# Patient Record
Sex: Female | Born: 1965 | Race: White | Hispanic: No | Marital: Married | Smoking: Never smoker
Health system: Southern US, Community
[De-identification: ages and names within clinical notes are randomized; demographics above are authoritative.]

## PROBLEM LIST (undated history)

## (undated) DIAGNOSIS — K219 Gastro-esophageal reflux disease without esophagitis: Secondary | ICD-10-CM

## (undated) DIAGNOSIS — Z9889 Other specified postprocedural states: Secondary | ICD-10-CM

## (undated) DIAGNOSIS — T7840XA Allergy, unspecified, initial encounter: Secondary | ICD-10-CM

## (undated) DIAGNOSIS — R112 Nausea with vomiting, unspecified: Secondary | ICD-10-CM

## (undated) HISTORY — DX: Other specified postprocedural states: R11.2

## (undated) HISTORY — DX: Other specified postprocedural states: Z98.890

## (undated) HISTORY — PX: BASAL CELL CARCINOMA EXCISION: SHX1214

## (undated) HISTORY — DX: Gastro-esophageal reflux disease without esophagitis: K21.9

## (undated) HISTORY — DX: Allergy, unspecified, initial encounter: T78.40XA

---

## 1997-12-17 ENCOUNTER — Other Ambulatory Visit: Admission: RE | Admit: 1997-12-17 | Discharge: 1997-12-17 | Payer: Self-pay | Admitting: Obstetrics and Gynecology

## 2000-03-08 ENCOUNTER — Other Ambulatory Visit: Admission: RE | Admit: 2000-03-08 | Discharge: 2000-03-08 | Payer: Self-pay | Admitting: Obstetrics and Gynecology

## 2001-05-24 ENCOUNTER — Other Ambulatory Visit: Admission: RE | Admit: 2001-05-24 | Discharge: 2001-05-24 | Payer: Self-pay | Admitting: Internal Medicine

## 2008-07-19 HISTORY — PX: CHOLECYSTECTOMY: SHX55

## 2009-05-09 ENCOUNTER — Encounter: Admission: RE | Admit: 2009-05-09 | Discharge: 2009-05-09 | Payer: Self-pay | Admitting: Family Medicine

## 2009-06-24 ENCOUNTER — Observation Stay (HOSPITAL_COMMUNITY): Admission: RE | Admit: 2009-06-24 | Discharge: 2009-06-25 | Payer: Self-pay | Admitting: Surgery

## 2009-06-24 ENCOUNTER — Encounter (INDEPENDENT_AMBULATORY_CARE_PROVIDER_SITE_OTHER): Payer: Self-pay | Admitting: Surgery

## 2010-04-04 IMAGING — RF DG CHOLANGIOGRAM OPERATIVE
1 series · 4 of 4 positions shown · non-contrast
Comparison: none

::  Gallstones.

Exam:  Diagnostic cholangiogram operative

[Series 1: run · 4 of 40 frames shown]
[frame 7/40]
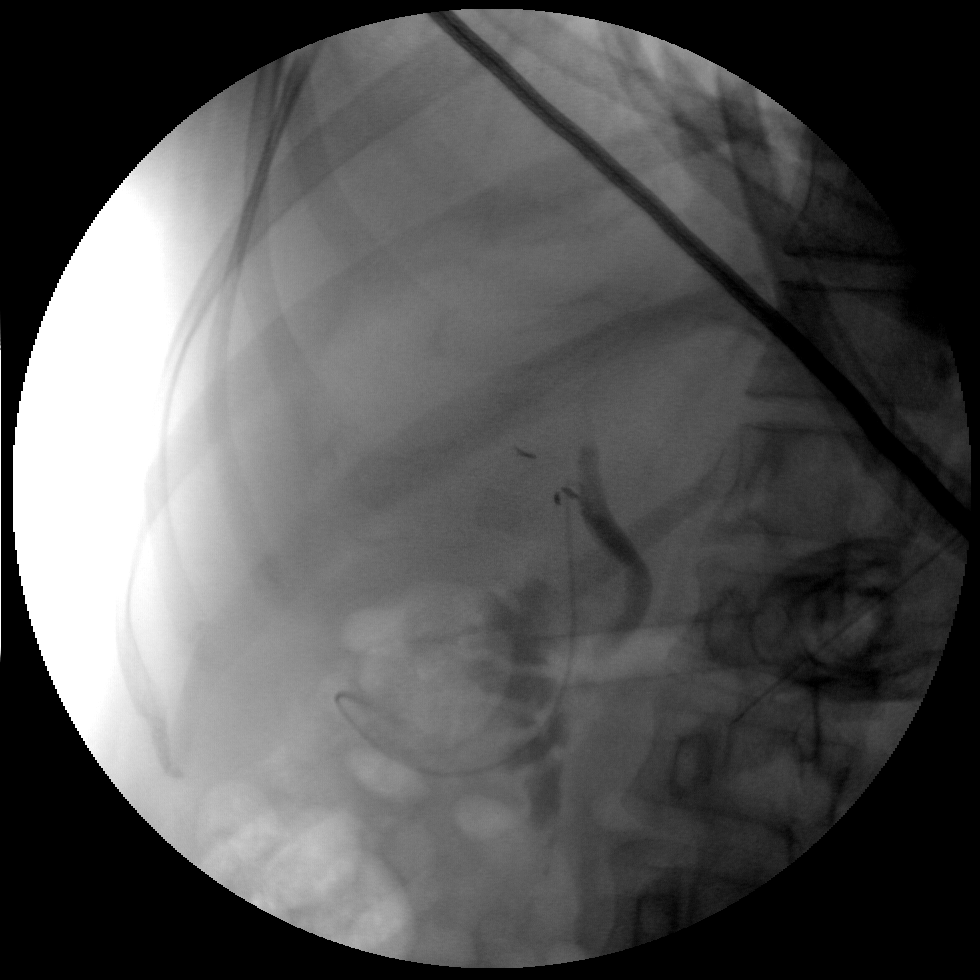
[frame 21/40]
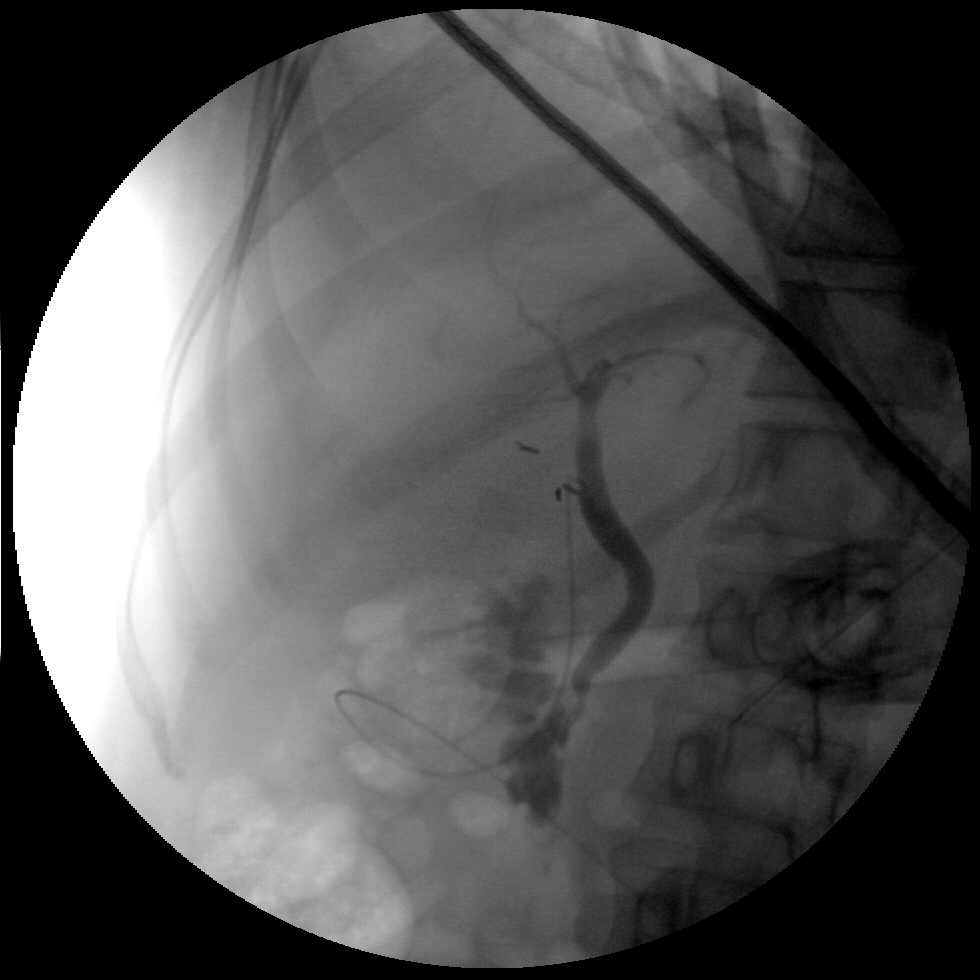
[frame 30/40]
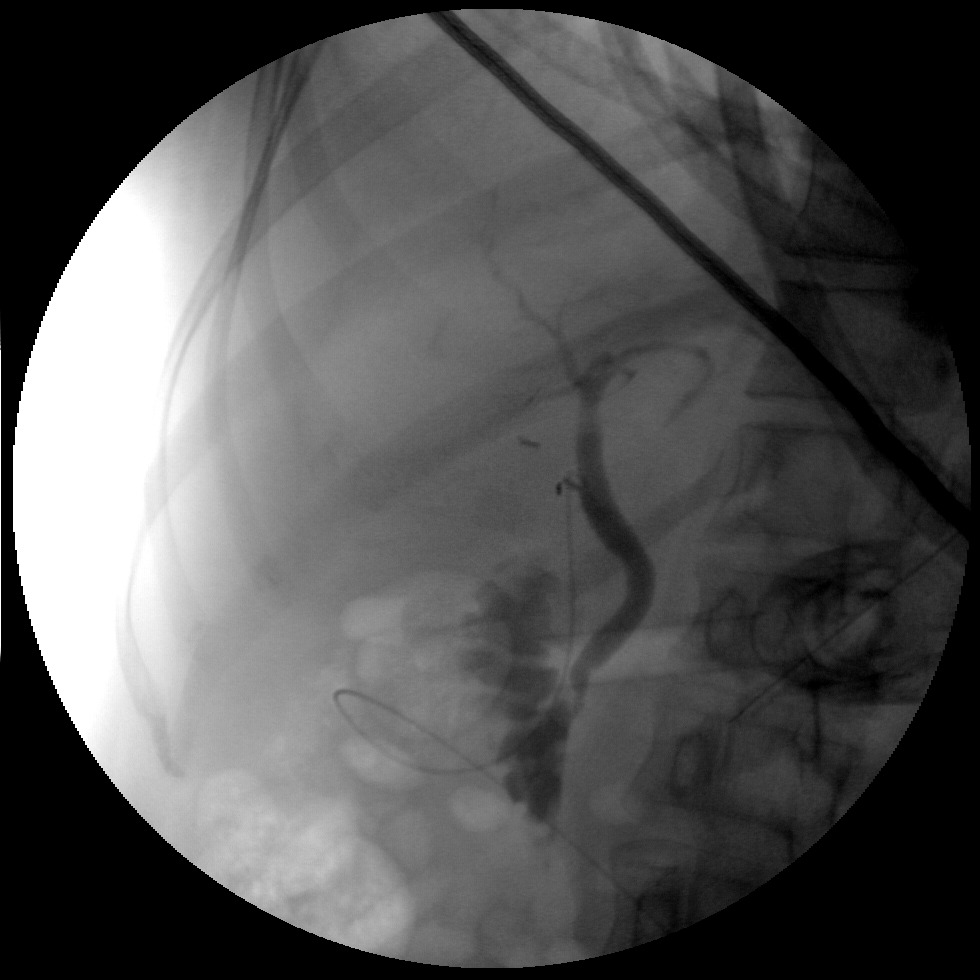
[frame 35/40]
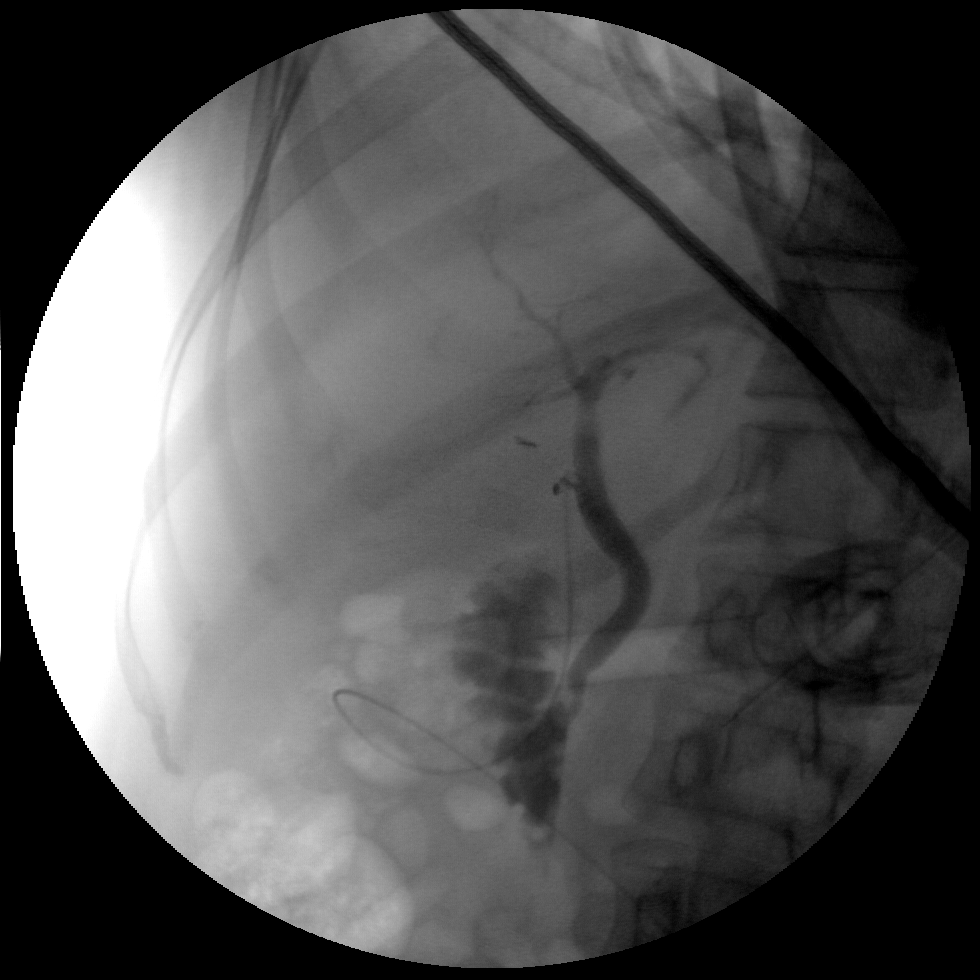

[4 of 4 positions shown; findings below may reference images not displayed]

FINDINGS: Contrast injection of the cystic duct led to the
excellent degree of opacification of the biliary ducts.  There is
no ductal stricture, dilatation, or filling defects to suggest
calculi.
IMPRESSION: Negative operative cholangiogram.

## 2010-07-19 DIAGNOSIS — D259 Leiomyoma of uterus, unspecified: Secondary | ICD-10-CM | POA: Insufficient documentation

## 2010-10-20 LAB — COMPREHENSIVE METABOLIC PANEL
ALT: 14 U/L (ref 0–35)
BUN: 8 mg/dL (ref 6–23)
CO2: 23 mEq/L (ref 19–32)
Calcium: 9.8 mg/dL (ref 8.4–10.5)
GFR calc Af Amer: >60 mL/min (ref >60)
Potassium: 4.4 mEq/L (ref 3.5–5.1)
Sodium: 138 mEq/L (ref 135–145)
Total Protein: 7 g/dL (ref 6.0–8.3)

## 2010-10-20 LAB — URINALYSIS, ROUTINE W REFLEX MICROSCOPIC
Bilirubin Urine: NEGATIVE
Hgb urine dipstick: NEGATIVE
Ketones, ur: NEGATIVE mg/dL

## 2010-10-20 LAB — CBC
HCT: 40.4 % (ref 36.0–46.0)
Platelets: 235 10*3/uL (ref 150–400)
RBC: 4.32 MIL/uL (ref 3.87–5.11)
RDW: 13.7 % (ref 11.5–15.5)
WBC: 5.5 10*3/uL (ref 4.0–10.5)

## 2010-10-20 LAB — DIFFERENTIAL
Basophils Absolute: 0 10*3/uL (ref 0.0–0.1)
Monocytes Relative: 7 % (ref 3–12)

## 2010-10-20 LAB — URINE MICROSCOPIC-ADD ON

## 2021-07-06 DIAGNOSIS — Z1231 Encounter for screening mammogram for malignant neoplasm of breast: Secondary | ICD-10-CM | POA: Diagnosis not present

## 2021-07-08 DIAGNOSIS — J301 Allergic rhinitis due to pollen: Secondary | ICD-10-CM | POA: Diagnosis not present

## 2021-07-08 DIAGNOSIS — J3089 Other allergic rhinitis: Secondary | ICD-10-CM | POA: Diagnosis not present

## 2021-07-08 DIAGNOSIS — J3081 Allergic rhinitis due to animal (cat) (dog) hair and dander: Secondary | ICD-10-CM | POA: Diagnosis not present

## 2021-07-09 DIAGNOSIS — Z124 Encounter for screening for malignant neoplasm of cervix: Secondary | ICD-10-CM | POA: Diagnosis not present

## 2021-07-09 DIAGNOSIS — Z1151 Encounter for screening for human papillomavirus (HPV): Secondary | ICD-10-CM | POA: Diagnosis not present

## 2021-07-09 DIAGNOSIS — Z6826 Body mass index (BMI) 26.0-26.9, adult: Secondary | ICD-10-CM | POA: Diagnosis not present

## 2021-07-09 DIAGNOSIS — Z01419 Encounter for gynecological examination (general) (routine) without abnormal findings: Secondary | ICD-10-CM | POA: Diagnosis not present

## 2021-07-09 DIAGNOSIS — Z130 Encounter for screening for diseases of the blood and blood-forming organs and certain disorders involving the immune mechanism: Secondary | ICD-10-CM | POA: Diagnosis not present

## 2021-08-06 DIAGNOSIS — J301 Allergic rhinitis due to pollen: Secondary | ICD-10-CM | POA: Diagnosis not present

## 2021-08-06 DIAGNOSIS — J3081 Allergic rhinitis due to animal (cat) (dog) hair and dander: Secondary | ICD-10-CM | POA: Diagnosis not present

## 2021-08-06 DIAGNOSIS — J3089 Other allergic rhinitis: Secondary | ICD-10-CM | POA: Diagnosis not present

## 2021-08-13 DIAGNOSIS — Z0000 Encounter for general adult medical examination without abnormal findings: Secondary | ICD-10-CM | POA: Diagnosis not present

## 2021-08-20 DIAGNOSIS — Z13220 Encounter for screening for lipoid disorders: Secondary | ICD-10-CM | POA: Diagnosis not present

## 2021-08-20 DIAGNOSIS — Z79899 Other long term (current) drug therapy: Secondary | ICD-10-CM | POA: Diagnosis not present

## 2021-09-04 DIAGNOSIS — J3089 Other allergic rhinitis: Secondary | ICD-10-CM | POA: Diagnosis not present

## 2021-09-04 DIAGNOSIS — J301 Allergic rhinitis due to pollen: Secondary | ICD-10-CM | POA: Diagnosis not present

## 2021-09-04 DIAGNOSIS — J3081 Allergic rhinitis due to animal (cat) (dog) hair and dander: Secondary | ICD-10-CM | POA: Diagnosis not present

## 2021-10-01 DIAGNOSIS — J3081 Allergic rhinitis due to animal (cat) (dog) hair and dander: Secondary | ICD-10-CM | POA: Diagnosis not present

## 2021-10-01 DIAGNOSIS — J301 Allergic rhinitis due to pollen: Secondary | ICD-10-CM | POA: Diagnosis not present

## 2021-10-01 DIAGNOSIS — J3089 Other allergic rhinitis: Secondary | ICD-10-CM | POA: Diagnosis not present

## 2021-10-30 DIAGNOSIS — J3081 Allergic rhinitis due to animal (cat) (dog) hair and dander: Secondary | ICD-10-CM | POA: Diagnosis not present

## 2021-10-30 DIAGNOSIS — J301 Allergic rhinitis due to pollen: Secondary | ICD-10-CM | POA: Diagnosis not present

## 2021-10-30 DIAGNOSIS — J3089 Other allergic rhinitis: Secondary | ICD-10-CM | POA: Diagnosis not present

## 2021-11-24 DIAGNOSIS — J3089 Other allergic rhinitis: Secondary | ICD-10-CM | POA: Diagnosis not present

## 2021-11-26 DIAGNOSIS — J301 Allergic rhinitis due to pollen: Secondary | ICD-10-CM | POA: Diagnosis not present

## 2021-11-26 DIAGNOSIS — J3081 Allergic rhinitis due to animal (cat) (dog) hair and dander: Secondary | ICD-10-CM | POA: Diagnosis not present

## 2021-11-26 DIAGNOSIS — J3089 Other allergic rhinitis: Secondary | ICD-10-CM | POA: Diagnosis not present

## 2021-12-25 DIAGNOSIS — J3081 Allergic rhinitis due to animal (cat) (dog) hair and dander: Secondary | ICD-10-CM | POA: Diagnosis not present

## 2021-12-25 DIAGNOSIS — J3089 Other allergic rhinitis: Secondary | ICD-10-CM | POA: Diagnosis not present

## 2021-12-25 DIAGNOSIS — J301 Allergic rhinitis due to pollen: Secondary | ICD-10-CM | POA: Diagnosis not present

## 2022-01-20 DIAGNOSIS — J301 Allergic rhinitis due to pollen: Secondary | ICD-10-CM | POA: Diagnosis not present

## 2022-01-20 DIAGNOSIS — J3081 Allergic rhinitis due to animal (cat) (dog) hair and dander: Secondary | ICD-10-CM | POA: Diagnosis not present

## 2022-01-20 DIAGNOSIS — J3089 Other allergic rhinitis: Secondary | ICD-10-CM | POA: Diagnosis not present

## 2022-01-22 DIAGNOSIS — J301 Allergic rhinitis due to pollen: Secondary | ICD-10-CM | POA: Diagnosis not present

## 2022-01-22 DIAGNOSIS — J3081 Allergic rhinitis due to animal (cat) (dog) hair and dander: Secondary | ICD-10-CM | POA: Diagnosis not present

## 2022-01-22 DIAGNOSIS — J3089 Other allergic rhinitis: Secondary | ICD-10-CM | POA: Diagnosis not present

## 2022-01-25 DIAGNOSIS — D225 Melanocytic nevi of trunk: Secondary | ICD-10-CM | POA: Diagnosis not present

## 2022-01-25 DIAGNOSIS — D2261 Melanocytic nevi of right upper limb, including shoulder: Secondary | ICD-10-CM | POA: Diagnosis not present

## 2022-01-25 DIAGNOSIS — L814 Other melanin hyperpigmentation: Secondary | ICD-10-CM | POA: Diagnosis not present

## 2022-01-25 DIAGNOSIS — L821 Other seborrheic keratosis: Secondary | ICD-10-CM | POA: Diagnosis not present

## 2022-01-26 DIAGNOSIS — J3081 Allergic rhinitis due to animal (cat) (dog) hair and dander: Secondary | ICD-10-CM | POA: Diagnosis not present

## 2022-01-26 DIAGNOSIS — J3089 Other allergic rhinitis: Secondary | ICD-10-CM | POA: Diagnosis not present

## 2022-01-26 DIAGNOSIS — J301 Allergic rhinitis due to pollen: Secondary | ICD-10-CM | POA: Diagnosis not present

## 2022-01-29 DIAGNOSIS — J301 Allergic rhinitis due to pollen: Secondary | ICD-10-CM | POA: Diagnosis not present

## 2022-01-29 DIAGNOSIS — J3081 Allergic rhinitis due to animal (cat) (dog) hair and dander: Secondary | ICD-10-CM | POA: Diagnosis not present

## 2022-01-29 DIAGNOSIS — J3089 Other allergic rhinitis: Secondary | ICD-10-CM | POA: Diagnosis not present

## 2022-02-03 DIAGNOSIS — J3081 Allergic rhinitis due to animal (cat) (dog) hair and dander: Secondary | ICD-10-CM | POA: Diagnosis not present

## 2022-02-03 DIAGNOSIS — J301 Allergic rhinitis due to pollen: Secondary | ICD-10-CM | POA: Diagnosis not present

## 2022-02-05 DIAGNOSIS — J301 Allergic rhinitis due to pollen: Secondary | ICD-10-CM | POA: Diagnosis not present

## 2022-02-05 DIAGNOSIS — J3081 Allergic rhinitis due to animal (cat) (dog) hair and dander: Secondary | ICD-10-CM | POA: Diagnosis not present

## 2022-02-05 DIAGNOSIS — J3089 Other allergic rhinitis: Secondary | ICD-10-CM | POA: Diagnosis not present

## 2022-03-01 DIAGNOSIS — J3089 Other allergic rhinitis: Secondary | ICD-10-CM | POA: Diagnosis not present

## 2022-03-01 DIAGNOSIS — H1045 Other chronic allergic conjunctivitis: Secondary | ICD-10-CM | POA: Diagnosis not present

## 2022-03-01 DIAGNOSIS — J3081 Allergic rhinitis due to animal (cat) (dog) hair and dander: Secondary | ICD-10-CM | POA: Diagnosis not present

## 2022-03-01 DIAGNOSIS — J301 Allergic rhinitis due to pollen: Secondary | ICD-10-CM | POA: Diagnosis not present

## 2022-03-26 DIAGNOSIS — J3089 Other allergic rhinitis: Secondary | ICD-10-CM | POA: Diagnosis not present

## 2022-03-26 DIAGNOSIS — J301 Allergic rhinitis due to pollen: Secondary | ICD-10-CM | POA: Diagnosis not present

## 2022-03-26 DIAGNOSIS — J3081 Allergic rhinitis due to animal (cat) (dog) hair and dander: Secondary | ICD-10-CM | POA: Diagnosis not present

## 2022-04-23 DIAGNOSIS — J3089 Other allergic rhinitis: Secondary | ICD-10-CM | POA: Diagnosis not present

## 2022-04-23 DIAGNOSIS — J301 Allergic rhinitis due to pollen: Secondary | ICD-10-CM | POA: Diagnosis not present

## 2022-04-23 DIAGNOSIS — J3081 Allergic rhinitis due to animal (cat) (dog) hair and dander: Secondary | ICD-10-CM | POA: Diagnosis not present

## 2022-04-30 DIAGNOSIS — J3089 Other allergic rhinitis: Secondary | ICD-10-CM | POA: Diagnosis not present

## 2022-04-30 DIAGNOSIS — J3081 Allergic rhinitis due to animal (cat) (dog) hair and dander: Secondary | ICD-10-CM | POA: Diagnosis not present

## 2022-04-30 DIAGNOSIS — J301 Allergic rhinitis due to pollen: Secondary | ICD-10-CM | POA: Diagnosis not present

## 2022-05-07 DIAGNOSIS — J3089 Other allergic rhinitis: Secondary | ICD-10-CM | POA: Diagnosis not present

## 2022-05-07 DIAGNOSIS — J3081 Allergic rhinitis due to animal (cat) (dog) hair and dander: Secondary | ICD-10-CM | POA: Diagnosis not present

## 2022-05-07 DIAGNOSIS — J301 Allergic rhinitis due to pollen: Secondary | ICD-10-CM | POA: Diagnosis not present

## 2022-05-14 DIAGNOSIS — J3089 Other allergic rhinitis: Secondary | ICD-10-CM | POA: Diagnosis not present

## 2022-05-14 DIAGNOSIS — J3081 Allergic rhinitis due to animal (cat) (dog) hair and dander: Secondary | ICD-10-CM | POA: Diagnosis not present

## 2022-05-14 DIAGNOSIS — J301 Allergic rhinitis due to pollen: Secondary | ICD-10-CM | POA: Diagnosis not present

## 2022-05-21 DIAGNOSIS — J301 Allergic rhinitis due to pollen: Secondary | ICD-10-CM | POA: Diagnosis not present

## 2022-05-21 DIAGNOSIS — J3089 Other allergic rhinitis: Secondary | ICD-10-CM | POA: Diagnosis not present

## 2022-05-21 DIAGNOSIS — J3081 Allergic rhinitis due to animal (cat) (dog) hair and dander: Secondary | ICD-10-CM | POA: Diagnosis not present

## 2022-06-18 DIAGNOSIS — J3081 Allergic rhinitis due to animal (cat) (dog) hair and dander: Secondary | ICD-10-CM | POA: Diagnosis not present

## 2022-06-18 DIAGNOSIS — J301 Allergic rhinitis due to pollen: Secondary | ICD-10-CM | POA: Diagnosis not present

## 2022-06-18 DIAGNOSIS — J3089 Other allergic rhinitis: Secondary | ICD-10-CM | POA: Diagnosis not present

## 2022-07-13 DIAGNOSIS — Z1231 Encounter for screening mammogram for malignant neoplasm of breast: Secondary | ICD-10-CM | POA: Diagnosis not present

## 2022-07-16 DIAGNOSIS — J3089 Other allergic rhinitis: Secondary | ICD-10-CM | POA: Diagnosis not present

## 2022-07-16 DIAGNOSIS — J301 Allergic rhinitis due to pollen: Secondary | ICD-10-CM | POA: Diagnosis not present

## 2022-07-16 DIAGNOSIS — J3081 Allergic rhinitis due to animal (cat) (dog) hair and dander: Secondary | ICD-10-CM | POA: Diagnosis not present

## 2022-08-16 DIAGNOSIS — J3089 Other allergic rhinitis: Secondary | ICD-10-CM | POA: Diagnosis not present

## 2022-08-16 DIAGNOSIS — J301 Allergic rhinitis due to pollen: Secondary | ICD-10-CM | POA: Diagnosis not present

## 2022-08-16 DIAGNOSIS — Z13220 Encounter for screening for lipoid disorders: Secondary | ICD-10-CM | POA: Diagnosis not present

## 2022-08-16 DIAGNOSIS — J3081 Allergic rhinitis due to animal (cat) (dog) hair and dander: Secondary | ICD-10-CM | POA: Diagnosis not present

## 2022-08-16 DIAGNOSIS — Z79899 Other long term (current) drug therapy: Secondary | ICD-10-CM | POA: Diagnosis not present

## 2022-08-18 DIAGNOSIS — Z23 Encounter for immunization: Secondary | ICD-10-CM | POA: Diagnosis not present

## 2022-08-18 DIAGNOSIS — Z0000 Encounter for general adult medical examination without abnormal findings: Secondary | ICD-10-CM | POA: Diagnosis not present

## 2022-09-02 DIAGNOSIS — R748 Abnormal levels of other serum enzymes: Secondary | ICD-10-CM | POA: Diagnosis not present

## 2022-09-02 DIAGNOSIS — D649 Anemia, unspecified: Secondary | ICD-10-CM | POA: Diagnosis not present

## 2022-09-07 ENCOUNTER — Encounter: Payer: Self-pay | Admitting: Internal Medicine

## 2022-09-13 DIAGNOSIS — J3081 Allergic rhinitis due to animal (cat) (dog) hair and dander: Secondary | ICD-10-CM | POA: Diagnosis not present

## 2022-09-13 DIAGNOSIS — J301 Allergic rhinitis due to pollen: Secondary | ICD-10-CM | POA: Diagnosis not present

## 2022-09-13 DIAGNOSIS — J3089 Other allergic rhinitis: Secondary | ICD-10-CM | POA: Diagnosis not present

## 2022-10-11 DIAGNOSIS — J301 Allergic rhinitis due to pollen: Secondary | ICD-10-CM | POA: Diagnosis not present

## 2022-10-11 DIAGNOSIS — J3081 Allergic rhinitis due to animal (cat) (dog) hair and dander: Secondary | ICD-10-CM | POA: Diagnosis not present

## 2022-10-11 DIAGNOSIS — J3089 Other allergic rhinitis: Secondary | ICD-10-CM | POA: Diagnosis not present

## 2022-11-08 DIAGNOSIS — J301 Allergic rhinitis due to pollen: Secondary | ICD-10-CM | POA: Diagnosis not present

## 2022-11-08 DIAGNOSIS — J3089 Other allergic rhinitis: Secondary | ICD-10-CM | POA: Diagnosis not present

## 2022-11-08 DIAGNOSIS — J3081 Allergic rhinitis due to animal (cat) (dog) hair and dander: Secondary | ICD-10-CM | POA: Diagnosis not present

## 2022-11-12 ENCOUNTER — Ambulatory Visit (AMBULATORY_SURGERY_CENTER): Payer: BC Managed Care – PPO

## 2022-11-12 VITALS — Ht 64.0 in | Wt 157.4 lb

## 2022-11-12 DIAGNOSIS — D251 Intramural leiomyoma of uterus: Secondary | ICD-10-CM | POA: Insufficient documentation

## 2022-11-12 DIAGNOSIS — Z1211 Encounter for screening for malignant neoplasm of colon: Secondary | ICD-10-CM

## 2022-11-12 MED ORDER — NA SULFATE-K SULFATE-MG SULF 17.5-3.13-1.6 GM/177ML PO SOLN: 1.0000 | Freq: Once | ORAL | 0 refills | Status: AC

## 2022-11-12 NOTE — Progress Notes (Signed)
No egg or soy allergy known to patient  Post operative nausea and vomiting  Patient denies ever being told they had issues or difficulty with intubation  No FH of Malignant Hyperthermia Pt is not on diet pills Pt is not on  home 02  Pt is not on blood thinners  Pt denies issues with constipation  No A fib or A flutter Have any cardiac testing pending--no  Pt instructed to use Single[employer] or GoodRx for a price reduction on prep   Patient's chart reviewed by Cathlyn Parsons CNRA prior to previsit and patient appropriate for the [facility].  Previsit completed and red dot placed by patient's name on their procedure day (on provider's schedule).

## 2022-11-29 ENCOUNTER — Encounter: Payer: Self-pay | Admitting: Internal Medicine

## 2022-12-03 DIAGNOSIS — J3081 Allergic rhinitis due to animal (cat) (dog) hair and dander: Secondary | ICD-10-CM | POA: Diagnosis not present

## 2022-12-03 DIAGNOSIS — J3089 Other allergic rhinitis: Secondary | ICD-10-CM | POA: Diagnosis not present

## 2022-12-03 DIAGNOSIS — J301 Allergic rhinitis due to pollen: Secondary | ICD-10-CM | POA: Diagnosis not present

## 2022-12-07 DIAGNOSIS — J301 Allergic rhinitis due to pollen: Secondary | ICD-10-CM | POA: Diagnosis not present

## 2022-12-07 DIAGNOSIS — J3081 Allergic rhinitis due to animal (cat) (dog) hair and dander: Secondary | ICD-10-CM | POA: Diagnosis not present

## 2022-12-08 DIAGNOSIS — J3089 Other allergic rhinitis: Secondary | ICD-10-CM | POA: Diagnosis not present

## 2022-12-10 ENCOUNTER — Encounter: Payer: Self-pay | Admitting: Internal Medicine

## 2022-12-10 ENCOUNTER — Ambulatory Visit: Payer: BC Managed Care – PPO | Admitting: Internal Medicine

## 2022-12-10 VITALS — BP 107/57 | HR 58 | Temp 98.6°F | Resp 9 | Ht 64.0 in | Wt 157.0 lb

## 2022-12-10 DIAGNOSIS — Z1211 Encounter for screening for malignant neoplasm of colon: Secondary | ICD-10-CM | POA: Diagnosis not present

## 2022-12-10 MED ORDER — SODIUM CHLORIDE 0.9 % IV SOLN: 500.0000 mL | Freq: Once | INTRAVENOUS | Status: DC

## 2022-12-10 NOTE — Op Note (Signed)
[facility] Patient Name: Jenny Davenport Procedure Date: 12/10/2022 11:27 AM MRN: [id number] Endoscopist: Beverley Fiedler , MD, [id number] Age: 57 Referring MD:  Date of Birth: [birth year] Gender: Female Account #: [id number] Procedure:                Colonoscopy Indications:              Screening for colorectal malignant neoplasm, This                            is the patient's first colonoscopy Medicines:                Monitored Anesthesia Care Procedure:                Pre-Anesthesia Assessment:                           - Prior to the procedure, a History and Physical                            was performed, and patient medications and                            allergies were reviewed. The patient's tolerance of                            previous anesthesia was also reviewed. The risks                            and benefits of the procedure and the sedation                            options and risks were discussed with the patient.                            All questions were answered, and informed consent                            was obtained. Prior Anticoagulants: The patient has                            taken no anticoagulant or antiplatelet agents. ASA                            Grade Assessment: II - A patient with mild systemic                            disease. After reviewing the risks and benefits,                            the patient was deemed in satisfactory condition to                            undergo the procedure.  After obtaining informed consent, the colonoscope                            was passed under direct vision. Throughout the                            procedure, the patient's blood pressure, pulse, and                            oxygen saturations were monitored continuously. The                            Olympus PCF-H190DL [id number]) Colonoscope was                            introduced through the anus  and advanced to the                            cecum, identified by its appearance. The                            colonoscopy was performed without difficulty. The                            patient tolerated the procedure well. The quality                            of the bowel preparation was excellent. The                            ileocecal valve, appendiceal orifice, and rectum                            were photographed. Scope In: 11:31:26 AM Scope Out: 11:42:48 AM Scope Withdrawal Time: 0 hours 7 minutes 59 seconds  Total Procedure Duration: 0 hours 11 minutes 22 seconds  Findings:                 The digital rectal exam was normal.                           The entire examined colon appeared normal on direct                            and retroflexion views. Complications:            No immediate complications. Estimated Blood Loss:     Estimated blood loss: none. Impression:               - The entire examined colon is normal on direct and                            retroflexion views.                           - No specimens collected. Recommendation:           -  Patient has a contact number available for                            emergencies. The signs and symptoms of potential                            delayed complications were discussed with the                            patient. Return to normal activities tomorrow.                            Written discharge instructions were provided to the                            patient.                           - Resume previous diet.                           - Continue present medications.                           - Repeat colonoscopy in 10 years for screening                            purposes. Beverley Fiedler, MD 12/10/2022 11:44:07 AM This report has been signed electronically.

## 2022-12-10 NOTE — Progress Notes (Signed)
Vitals-DT  Pt's states no medical or surgical changes since previsit or office visit.  

## 2022-12-10 NOTE — Progress Notes (Signed)
GASTROENTEROLOGY PROCEDURE H&P NOTE   Primary Care Physician: Shon Hale, MD    Reason for Procedure:  Colon cancer screening  Plan:    Colonoscopy  Patient is appropriate for endoscopic procedure(s) in the ambulatory ([facility]) setting.  The nature of the procedure, as well as the risks, benefits, and alternatives were carefully and thoroughly reviewed with the patient. Ample time for discussion and questions allowed. The patient understood, was satisfied, and agreed to proceed.     HPI: Jenny Davenport is a 57 y.o. female who presents for screening colonoscopy.  Medical history as below.  Tolerated the prep.  No recent chest pain or shortness of breath.  No abdominal pain today.  Past Medical History:  Diagnosis Date   Allergy    GERD (gastroesophageal reflux disease)    PONV (postoperative nausea and vomiting)     Past Surgical History:  Procedure Laterality Date   BASAL CELL CARCINOMA EXCISION     under nose   CHOLECYSTECTOMY  2010    Prior to Admission medications   Medication Sig Start Date End Date Taking? Authorizing Provider  famotidine (PEPCID) 20 MG tablet Take 20 mg by mouth every evening.   Yes [provider]  Levocetirizine Dihydrochloride (XYZAL PO) Take 1 tablet by mouth every evening.   Yes [provider]  EPINEPHrine 0.3 mg/0.3 mL IJ SOAJ injection Inject 0.3 mg into the muscle as needed.    [provider]    Current Outpatient Medications  Medication Sig Dispense Refill   famotidine (PEPCID) 20 MG tablet Take 20 mg by mouth every evening.     Levocetirizine Dihydrochloride (XYZAL PO) Take 1 tablet by mouth every evening.     EPINEPHrine 0.3 mg/0.3 mL IJ SOAJ injection Inject 0.3 mg into the muscle as needed.     Current Facility-Administered Medications  Medication Dose Route Frequency Provider Last Rate Last Admin   0.9 %  sodium chloride infusion  500 mL Intravenous Once Ayan Yankey, Carie Caddy, MD        Allergies as  of 12/10/2022   (No Known Allergies)    Family History  Problem Relation Age of Onset   Colon polyps Mother    Crohn's disease Father    Esophageal cancer Neg Hx    Rectal cancer Neg Hx    Stomach cancer Neg Hx     Social History   Socioeconomic History   Marital status: Married    Spouse name: Not on file   Number of children: Not on file   Years of education: Not on file   Highest education level: Not on file  Occupational History   Not on file  Tobacco Use   Smoking status: Never   Smokeless tobacco: Never  Vaping Use   Vaping Use: Never used  Substance and Sexual Activity   Alcohol use: Yes    Comment: occ   Drug use: Never   Sexual activity: Not on file  Other Topics Concern   Not on file  Social History Narrative   Not on file   Social Determinants of Health   Financial Resource Strain: Not on file  Food Insecurity: Not on file  Transportation Needs: Not on file  Physical Activity: Not on file  Stress: Not on file  Social Connections: Not on file  Intimate Partner Violence: Not on file    Physical Exam: Vital signs in last 24 hours: @BP  (!) 119/51 (BP Location: Right Arm, Patient Position: Sitting, Cuff Size: Normal)  Pulse 65   Temp 98.6 F (37 C) (Temporal)   Ht 5\' 4"  (1.626 m)   Wt 157 lb (71.2 kg)   SpO2 100%   BMI 26.95 kg/m  GEN: NAD EYE: Sclerae anicteric ENT: MMM CV: Non-tachycardic Pulm: CTA b/l GI: Soft, NT/ND NEURO:  Alert & Oriented x 3   Erick Blinks, MD [facility]  12/10/2022 11:20 AM

## 2022-12-10 NOTE — Patient Instructions (Signed)
Thank you for coming in to see us today. Resume previous diet and medications today Return to regular daily activities tomorrow. Recommend next screening colonoscopy in 10 years.    YOU HAD AN ENDOSCOPIC PROCEDURE TODAY AT THE [facility]:   Refer to the procedure report that was given to you for any specific questions about what was found during the examination.  If the procedure report does not answer your questions, please call your gastroenterologist to clarify.  If you requested that your care partner not be given the details of your procedure findings, then the procedure report has been included in a sealed envelope for you to review at your convenience later.  YOU SHOULD EXPECT: Some feelings of bloating in the abdomen. Passage of more gas than usual.  Walking can help get rid of the air that was put into your GI tract during the procedure and reduce the bloating. If you had a lower endoscopy (such as a colonoscopy or flexible sigmoidoscopy) you may notice spotting of blood in your stool or on the toilet paper. If you underwent a bowel prep for your procedure, you may not have a normal bowel movement for a few days.  Please Note:  You might notice some irritation and congestion in your nose or some drainage.  This is from the oxygen used during your procedure.  There is no need for concern and it should clear up in a day or so.  SYMPTOMS TO REPORT IMMEDIATELY:  Following lower endoscopy (colonoscopy or flexible sigmoidoscopy):  Excessive amounts of blood in the stool  Significant tenderness or worsening of abdominal pains  Swelling of the abdomen that is new, acute  Fever of 100F or higher    For urgent or emergent issues, a gastroenterologist can be reached at any hour by calling [phone]. Do not use MyChart messaging for urgent concerns.    DIET:  We do recommend a small meal at first, but then you may proceed to your regular diet.  Drink plenty of fluids but  you should avoid alcoholic beverages for 24 hours.  ACTIVITY:  You should plan to take it easy for the rest of today and you should NOT DRIVE or use heavy machinery until tomorrow (because of the sedation medicines used during the test).    FOLLOW UP: Our staff will call the number listed on your records the next business day following your procedure.  We will call around 7:15- 8:00 am to check on you and address any questions or concerns that you may have regarding the information given to you following your procedure. If we do not reach you, we will leave a message.     If any biopsies were taken you will be contacted by phone or by letter within the next 1-3 weeks.  Please call us at [phone] if you have not heard about the biopsies in 3 weeks.    SIGNATURES/CONFIDENTIALITY: You and/or your care partner have signed paperwork which will be entered into your electronic medical record.  These signatures attest to the fact that that the information above on your After Visit Summary has been reviewed and is understood.  Full responsibility of the confidentiality of this discharge information lies with you and/or your care-partner. 

## 2022-12-14 ENCOUNTER — Telehealth: Payer: Self-pay | Admitting: *Deleted

## 2022-12-14 NOTE — Telephone Encounter (Signed)
  Follow up Call-     12/10/2022   10:34 AM 12/10/2022   10:27 AM  Call back number  Post procedure Call Back phone  # [phone]   Permission to leave phone message  Yes     Patient questions:  Do you have a fever, pain , or abdominal swelling? No. Pain Score  0 *  Have you tolerated food without any problems? Yes.    Have you been able to return to your normal activities? Yes.    Do you have any questions about your discharge instructions: Diet   No. Medications  No. Follow up visit  No.  Do you have questions or concerns about your Care? No.  Actions: * If pain score is 4 or above: No action needed, pain <4.

## 2022-12-30 DIAGNOSIS — J3081 Allergic rhinitis due to animal (cat) (dog) hair and dander: Secondary | ICD-10-CM | POA: Diagnosis not present

## 2022-12-30 DIAGNOSIS — J3089 Other allergic rhinitis: Secondary | ICD-10-CM | POA: Diagnosis not present

## 2022-12-30 DIAGNOSIS — J301 Allergic rhinitis due to pollen: Secondary | ICD-10-CM | POA: Diagnosis not present

## 2022-12-31 NOTE — Telephone Encounter (Signed)
error 

## 2023-01-10 DIAGNOSIS — J3081 Allergic rhinitis due to animal (cat) (dog) hair and dander: Secondary | ICD-10-CM | POA: Diagnosis not present

## 2023-01-10 DIAGNOSIS — J301 Allergic rhinitis due to pollen: Secondary | ICD-10-CM | POA: Diagnosis not present

## 2023-01-10 DIAGNOSIS — J3089 Other allergic rhinitis: Secondary | ICD-10-CM | POA: Diagnosis not present

## 2023-01-17 DIAGNOSIS — J3081 Allergic rhinitis due to animal (cat) (dog) hair and dander: Secondary | ICD-10-CM | POA: Diagnosis not present

## 2023-01-17 DIAGNOSIS — J3089 Other allergic rhinitis: Secondary | ICD-10-CM | POA: Diagnosis not present

## 2023-01-17 DIAGNOSIS — J301 Allergic rhinitis due to pollen: Secondary | ICD-10-CM | POA: Diagnosis not present

## 2023-01-24 DIAGNOSIS — J3081 Allergic rhinitis due to animal (cat) (dog) hair and dander: Secondary | ICD-10-CM | POA: Diagnosis not present

## 2023-01-24 DIAGNOSIS — J3089 Other allergic rhinitis: Secondary | ICD-10-CM | POA: Diagnosis not present

## 2023-01-24 DIAGNOSIS — J301 Allergic rhinitis due to pollen: Secondary | ICD-10-CM | POA: Diagnosis not present

## 2023-01-31 DIAGNOSIS — J3081 Allergic rhinitis due to animal (cat) (dog) hair and dander: Secondary | ICD-10-CM | POA: Diagnosis not present

## 2023-01-31 DIAGNOSIS — J3089 Other allergic rhinitis: Secondary | ICD-10-CM | POA: Diagnosis not present

## 2023-01-31 DIAGNOSIS — J301 Allergic rhinitis due to pollen: Secondary | ICD-10-CM | POA: Diagnosis not present

## 2023-02-01 DIAGNOSIS — L821 Other seborrheic keratosis: Secondary | ICD-10-CM | POA: Diagnosis not present

## 2023-02-01 DIAGNOSIS — L814 Other melanin hyperpigmentation: Secondary | ICD-10-CM | POA: Diagnosis not present

## 2023-02-01 DIAGNOSIS — L719 Rosacea, unspecified: Secondary | ICD-10-CM | POA: Diagnosis not present

## 2023-02-01 DIAGNOSIS — D0471 Carcinoma in situ of skin of right lower limb, including hip: Secondary | ICD-10-CM | POA: Diagnosis not present

## 2023-02-01 DIAGNOSIS — D485 Neoplasm of uncertain behavior of skin: Secondary | ICD-10-CM | POA: Diagnosis not present

## 2023-02-01 DIAGNOSIS — D0472 Carcinoma in situ of skin of left lower limb, including hip: Secondary | ICD-10-CM | POA: Diagnosis not present

## 2023-02-01 DIAGNOSIS — D225 Melanocytic nevi of trunk: Secondary | ICD-10-CM | POA: Diagnosis not present

## 2023-02-07 DIAGNOSIS — J3089 Other allergic rhinitis: Secondary | ICD-10-CM | POA: Diagnosis not present

## 2023-02-07 DIAGNOSIS — J3081 Allergic rhinitis due to animal (cat) (dog) hair and dander: Secondary | ICD-10-CM | POA: Diagnosis not present

## 2023-02-07 DIAGNOSIS — J301 Allergic rhinitis due to pollen: Secondary | ICD-10-CM | POA: Diagnosis not present

## 2023-03-03 DIAGNOSIS — J3081 Allergic rhinitis due to animal (cat) (dog) hair and dander: Secondary | ICD-10-CM | POA: Diagnosis not present

## 2023-03-03 DIAGNOSIS — J301 Allergic rhinitis due to pollen: Secondary | ICD-10-CM | POA: Diagnosis not present

## 2023-03-03 DIAGNOSIS — H1045 Other chronic allergic conjunctivitis: Secondary | ICD-10-CM | POA: Diagnosis not present

## 2023-03-03 DIAGNOSIS — J3089 Other allergic rhinitis: Secondary | ICD-10-CM | POA: Diagnosis not present

## 2023-03-30 DIAGNOSIS — J3089 Other allergic rhinitis: Secondary | ICD-10-CM | POA: Diagnosis not present

## 2023-03-30 DIAGNOSIS — J301 Allergic rhinitis due to pollen: Secondary | ICD-10-CM | POA: Diagnosis not present

## 2023-03-30 DIAGNOSIS — J3081 Allergic rhinitis due to animal (cat) (dog) hair and dander: Secondary | ICD-10-CM | POA: Diagnosis not present

## 2023-04-26 DIAGNOSIS — J3081 Allergic rhinitis due to animal (cat) (dog) hair and dander: Secondary | ICD-10-CM | POA: Diagnosis not present

## 2023-04-26 DIAGNOSIS — J3089 Other allergic rhinitis: Secondary | ICD-10-CM | POA: Diagnosis not present

## 2023-04-26 DIAGNOSIS — J301 Allergic rhinitis due to pollen: Secondary | ICD-10-CM | POA: Diagnosis not present

## 2023-05-11 DIAGNOSIS — D099 Carcinoma in situ, unspecified: Secondary | ICD-10-CM | POA: Diagnosis not present

## 2023-05-11 DIAGNOSIS — L820 Inflamed seborrheic keratosis: Secondary | ICD-10-CM | POA: Diagnosis not present

## 2023-05-24 DIAGNOSIS — J3089 Other allergic rhinitis: Secondary | ICD-10-CM | POA: Diagnosis not present

## 2023-05-24 DIAGNOSIS — J3081 Allergic rhinitis due to animal (cat) (dog) hair and dander: Secondary | ICD-10-CM | POA: Diagnosis not present

## 2023-05-24 DIAGNOSIS — J301 Allergic rhinitis due to pollen: Secondary | ICD-10-CM | POA: Diagnosis not present

## 2023-06-21 DIAGNOSIS — H0011 Chalazion right upper eyelid: Secondary | ICD-10-CM | POA: Diagnosis not present

## 2023-06-21 DIAGNOSIS — J3089 Other allergic rhinitis: Secondary | ICD-10-CM | POA: Diagnosis not present

## 2023-06-21 DIAGNOSIS — J3081 Allergic rhinitis due to animal (cat) (dog) hair and dander: Secondary | ICD-10-CM | POA: Diagnosis not present

## 2023-06-21 DIAGNOSIS — J301 Allergic rhinitis due to pollen: Secondary | ICD-10-CM | POA: Diagnosis not present

## 2023-07-18 DIAGNOSIS — J301 Allergic rhinitis due to pollen: Secondary | ICD-10-CM | POA: Diagnosis not present

## 2023-07-18 DIAGNOSIS — J3081 Allergic rhinitis due to animal (cat) (dog) hair and dander: Secondary | ICD-10-CM | POA: Diagnosis not present

## 2023-07-18 DIAGNOSIS — J3089 Other allergic rhinitis: Secondary | ICD-10-CM | POA: Diagnosis not present

## 2023-07-19 DIAGNOSIS — Z1231 Encounter for screening mammogram for malignant neoplasm of breast: Secondary | ICD-10-CM | POA: Diagnosis not present

## 2023-08-11 DIAGNOSIS — J3089 Other allergic rhinitis: Secondary | ICD-10-CM | POA: Diagnosis not present

## 2023-08-11 DIAGNOSIS — J3081 Allergic rhinitis due to animal (cat) (dog) hair and dander: Secondary | ICD-10-CM | POA: Diagnosis not present

## 2023-08-11 DIAGNOSIS — J301 Allergic rhinitis due to pollen: Secondary | ICD-10-CM | POA: Diagnosis not present

## 2023-08-18 DIAGNOSIS — Z13220 Encounter for screening for lipoid disorders: Secondary | ICD-10-CM | POA: Diagnosis not present

## 2023-08-18 DIAGNOSIS — Z79899 Other long term (current) drug therapy: Secondary | ICD-10-CM | POA: Diagnosis not present

## 2023-08-22 DIAGNOSIS — Z0000 Encounter for general adult medical examination without abnormal findings: Secondary | ICD-10-CM | POA: Diagnosis not present

## 2023-09-12 DIAGNOSIS — R053 Chronic cough: Secondary | ICD-10-CM | POA: Diagnosis not present

## 2023-09-19 DIAGNOSIS — J3089 Other allergic rhinitis: Secondary | ICD-10-CM | POA: Diagnosis not present

## 2023-09-19 DIAGNOSIS — J301 Allergic rhinitis due to pollen: Secondary | ICD-10-CM | POA: Diagnosis not present

## 2023-09-19 DIAGNOSIS — J3081 Allergic rhinitis due to animal (cat) (dog) hair and dander: Secondary | ICD-10-CM | POA: Diagnosis not present

## 2023-10-14 DIAGNOSIS — J3089 Other allergic rhinitis: Secondary | ICD-10-CM | POA: Diagnosis not present

## 2023-10-14 DIAGNOSIS — J301 Allergic rhinitis due to pollen: Secondary | ICD-10-CM | POA: Diagnosis not present

## 2023-10-14 DIAGNOSIS — J3081 Allergic rhinitis due to animal (cat) (dog) hair and dander: Secondary | ICD-10-CM | POA: Diagnosis not present

## 2023-10-26 DIAGNOSIS — H02885 Meibomian gland dysfunction left lower eyelid: Secondary | ICD-10-CM | POA: Diagnosis not present

## 2023-10-26 DIAGNOSIS — H02882 Meibomian gland dysfunction right lower eyelid: Secondary | ICD-10-CM | POA: Diagnosis not present

## 2023-10-26 DIAGNOSIS — H0011 Chalazion right upper eyelid: Secondary | ICD-10-CM | POA: Diagnosis not present

## 2023-10-26 DIAGNOSIS — B880 Other acariasis: Secondary | ICD-10-CM | POA: Diagnosis not present

## 2023-11-14 DIAGNOSIS — J3081 Allergic rhinitis due to animal (cat) (dog) hair and dander: Secondary | ICD-10-CM | POA: Diagnosis not present

## 2023-11-14 DIAGNOSIS — J3089 Other allergic rhinitis: Secondary | ICD-10-CM | POA: Diagnosis not present

## 2023-11-14 DIAGNOSIS — J301 Allergic rhinitis due to pollen: Secondary | ICD-10-CM | POA: Diagnosis not present

## 2023-11-23 DIAGNOSIS — H02882 Meibomian gland dysfunction right lower eyelid: Secondary | ICD-10-CM | POA: Diagnosis not present

## 2023-11-23 DIAGNOSIS — H02885 Meibomian gland dysfunction left lower eyelid: Secondary | ICD-10-CM | POA: Diagnosis not present

## 2023-12-09 DIAGNOSIS — J3089 Other allergic rhinitis: Secondary | ICD-10-CM | POA: Diagnosis not present

## 2023-12-09 DIAGNOSIS — J301 Allergic rhinitis due to pollen: Secondary | ICD-10-CM | POA: Diagnosis not present

## 2023-12-09 DIAGNOSIS — J3081 Allergic rhinitis due to animal (cat) (dog) hair and dander: Secondary | ICD-10-CM | POA: Diagnosis not present

## 2023-12-28 DIAGNOSIS — B880 Other acariasis: Secondary | ICD-10-CM | POA: Diagnosis not present

## 2023-12-28 DIAGNOSIS — H0011 Chalazion right upper eyelid: Secondary | ICD-10-CM | POA: Diagnosis not present

## 2023-12-28 DIAGNOSIS — H02882 Meibomian gland dysfunction right lower eyelid: Secondary | ICD-10-CM | POA: Diagnosis not present

## 2024-01-04 DIAGNOSIS — J301 Allergic rhinitis due to pollen: Secondary | ICD-10-CM | POA: Diagnosis not present

## 2024-01-04 DIAGNOSIS — J3089 Other allergic rhinitis: Secondary | ICD-10-CM | POA: Diagnosis not present

## 2024-01-04 DIAGNOSIS — J3081 Allergic rhinitis due to animal (cat) (dog) hair and dander: Secondary | ICD-10-CM | POA: Diagnosis not present

## 2024-01-10 DIAGNOSIS — J301 Allergic rhinitis due to pollen: Secondary | ICD-10-CM | POA: Diagnosis not present

## 2024-01-10 DIAGNOSIS — J3081 Allergic rhinitis due to animal (cat) (dog) hair and dander: Secondary | ICD-10-CM | POA: Diagnosis not present

## 2024-01-11 DIAGNOSIS — J3089 Other allergic rhinitis: Secondary | ICD-10-CM | POA: Diagnosis not present

## 2024-01-30 DIAGNOSIS — J3089 Other allergic rhinitis: Secondary | ICD-10-CM | POA: Diagnosis not present

## 2024-01-30 DIAGNOSIS — J301 Allergic rhinitis due to pollen: Secondary | ICD-10-CM | POA: Diagnosis not present

## 2024-01-30 DIAGNOSIS — J3081 Allergic rhinitis due to animal (cat) (dog) hair and dander: Secondary | ICD-10-CM | POA: Diagnosis not present

## 2024-02-06 DIAGNOSIS — J301 Allergic rhinitis due to pollen: Secondary | ICD-10-CM | POA: Diagnosis not present

## 2024-02-06 DIAGNOSIS — J3081 Allergic rhinitis due to animal (cat) (dog) hair and dander: Secondary | ICD-10-CM | POA: Diagnosis not present

## 2024-02-06 DIAGNOSIS — J3089 Other allergic rhinitis: Secondary | ICD-10-CM | POA: Diagnosis not present

## 2024-02-13 DIAGNOSIS — J301 Allergic rhinitis due to pollen: Secondary | ICD-10-CM | POA: Diagnosis not present

## 2024-02-13 DIAGNOSIS — J3081 Allergic rhinitis due to animal (cat) (dog) hair and dander: Secondary | ICD-10-CM | POA: Diagnosis not present

## 2024-02-13 DIAGNOSIS — J3089 Other allergic rhinitis: Secondary | ICD-10-CM | POA: Diagnosis not present

## 2024-02-14 DIAGNOSIS — L821 Other seborrheic keratosis: Secondary | ICD-10-CM | POA: Diagnosis not present

## 2024-02-14 DIAGNOSIS — L719 Rosacea, unspecified: Secondary | ICD-10-CM | POA: Diagnosis not present

## 2024-02-14 DIAGNOSIS — D485 Neoplasm of uncertain behavior of skin: Secondary | ICD-10-CM | POA: Diagnosis not present

## 2024-02-14 DIAGNOSIS — L814 Other melanin hyperpigmentation: Secondary | ICD-10-CM | POA: Diagnosis not present

## 2024-02-14 DIAGNOSIS — D0461 Carcinoma in situ of skin of right upper limb, including shoulder: Secondary | ICD-10-CM | POA: Diagnosis not present

## 2024-02-14 DIAGNOSIS — D225 Melanocytic nevi of trunk: Secondary | ICD-10-CM | POA: Diagnosis not present

## 2024-02-20 DIAGNOSIS — J3081 Allergic rhinitis due to animal (cat) (dog) hair and dander: Secondary | ICD-10-CM | POA: Diagnosis not present

## 2024-02-20 DIAGNOSIS — J301 Allergic rhinitis due to pollen: Secondary | ICD-10-CM | POA: Diagnosis not present

## 2024-02-20 DIAGNOSIS — J3089 Other allergic rhinitis: Secondary | ICD-10-CM | POA: Diagnosis not present

## 2024-02-23 DIAGNOSIS — J3089 Other allergic rhinitis: Secondary | ICD-10-CM | POA: Diagnosis not present

## 2024-02-23 DIAGNOSIS — H1045 Other chronic allergic conjunctivitis: Secondary | ICD-10-CM | POA: Diagnosis not present

## 2024-02-23 DIAGNOSIS — J301 Allergic rhinitis due to pollen: Secondary | ICD-10-CM | POA: Diagnosis not present

## 2024-02-23 DIAGNOSIS — J3081 Allergic rhinitis due to animal (cat) (dog) hair and dander: Secondary | ICD-10-CM | POA: Diagnosis not present

## 2024-02-27 DIAGNOSIS — J3089 Other allergic rhinitis: Secondary | ICD-10-CM | POA: Diagnosis not present

## 2024-02-27 DIAGNOSIS — J3081 Allergic rhinitis due to animal (cat) (dog) hair and dander: Secondary | ICD-10-CM | POA: Diagnosis not present

## 2024-02-27 DIAGNOSIS — J301 Allergic rhinitis due to pollen: Secondary | ICD-10-CM | POA: Diagnosis not present

## 2024-03-05 DIAGNOSIS — H02841 Edema of right upper eyelid: Secondary | ICD-10-CM | POA: Diagnosis not present

## 2024-03-05 DIAGNOSIS — H00021 Hordeolum internum right upper eyelid: Secondary | ICD-10-CM | POA: Diagnosis not present

## 2024-03-05 DIAGNOSIS — H01009 Unspecified blepharitis unspecified eye, unspecified eyelid: Secondary | ICD-10-CM | POA: Diagnosis not present

## 2024-03-26 DIAGNOSIS — J3089 Other allergic rhinitis: Secondary | ICD-10-CM | POA: Diagnosis not present

## 2024-03-26 DIAGNOSIS — J3081 Allergic rhinitis due to animal (cat) (dog) hair and dander: Secondary | ICD-10-CM | POA: Diagnosis not present

## 2024-03-26 DIAGNOSIS — J301 Allergic rhinitis due to pollen: Secondary | ICD-10-CM | POA: Diagnosis not present

## 2024-04-11 DIAGNOSIS — D239 Other benign neoplasm of skin, unspecified: Secondary | ICD-10-CM | POA: Diagnosis not present

## 2024-04-11 DIAGNOSIS — L739 Follicular disorder, unspecified: Secondary | ICD-10-CM | POA: Diagnosis not present

## 2024-04-11 DIAGNOSIS — D099 Carcinoma in situ, unspecified: Secondary | ICD-10-CM | POA: Diagnosis not present

## 2024-04-23 DIAGNOSIS — J3081 Allergic rhinitis due to animal (cat) (dog) hair and dander: Secondary | ICD-10-CM | POA: Diagnosis not present

## 2024-04-23 DIAGNOSIS — J301 Allergic rhinitis due to pollen: Secondary | ICD-10-CM | POA: Diagnosis not present

## 2024-04-23 DIAGNOSIS — J3089 Other allergic rhinitis: Secondary | ICD-10-CM | POA: Diagnosis not present

## 2024-05-18 DIAGNOSIS — J301 Allergic rhinitis due to pollen: Secondary | ICD-10-CM | POA: Diagnosis not present

## 2024-05-18 DIAGNOSIS — J3081 Allergic rhinitis due to animal (cat) (dog) hair and dander: Secondary | ICD-10-CM | POA: Diagnosis not present

## 2024-05-18 DIAGNOSIS — J3089 Other allergic rhinitis: Secondary | ICD-10-CM | POA: Diagnosis not present

## 2024-05-22 DIAGNOSIS — L111 Transient acantholytic dermatosis [Grover]: Secondary | ICD-10-CM | POA: Diagnosis not present

## 2024-05-24 DIAGNOSIS — J301 Allergic rhinitis due to pollen: Secondary | ICD-10-CM | POA: Diagnosis not present

## 2024-05-24 DIAGNOSIS — J3081 Allergic rhinitis due to animal (cat) (dog) hair and dander: Secondary | ICD-10-CM | POA: Diagnosis not present

## 2024-05-24 DIAGNOSIS — J3089 Other allergic rhinitis: Secondary | ICD-10-CM | POA: Diagnosis not present

## 2024-05-31 DIAGNOSIS — J3081 Allergic rhinitis due to animal (cat) (dog) hair and dander: Secondary | ICD-10-CM | POA: Diagnosis not present

## 2024-05-31 DIAGNOSIS — J301 Allergic rhinitis due to pollen: Secondary | ICD-10-CM | POA: Diagnosis not present

## 2024-05-31 DIAGNOSIS — J3089 Other allergic rhinitis: Secondary | ICD-10-CM | POA: Diagnosis not present

## 2024-06-07 DIAGNOSIS — J3089 Other allergic rhinitis: Secondary | ICD-10-CM | POA: Diagnosis not present

## 2024-06-07 DIAGNOSIS — J3081 Allergic rhinitis due to animal (cat) (dog) hair and dander: Secondary | ICD-10-CM | POA: Diagnosis not present

## 2024-06-07 DIAGNOSIS — J301 Allergic rhinitis due to pollen: Secondary | ICD-10-CM | POA: Diagnosis not present

## 2024-06-12 DIAGNOSIS — J3089 Other allergic rhinitis: Secondary | ICD-10-CM | POA: Diagnosis not present

## 2024-06-12 DIAGNOSIS — J3081 Allergic rhinitis due to animal (cat) (dog) hair and dander: Secondary | ICD-10-CM | POA: Diagnosis not present

## 2024-06-12 DIAGNOSIS — J301 Allergic rhinitis due to pollen: Secondary | ICD-10-CM | POA: Diagnosis not present

## 2024-07-09 DIAGNOSIS — J301 Allergic rhinitis due to pollen: Secondary | ICD-10-CM | POA: Diagnosis not present

## 2024-07-09 DIAGNOSIS — J3089 Other allergic rhinitis: Secondary | ICD-10-CM | POA: Diagnosis not present

## 2024-07-09 DIAGNOSIS — J3081 Allergic rhinitis due to animal (cat) (dog) hair and dander: Secondary | ICD-10-CM | POA: Diagnosis not present
